# Patient Record
Sex: Female | Born: 2000 | Race: White | Hispanic: No | Marital: Single | Smoking: Never smoker
Health system: Southern US, Community
[De-identification: ages and names within clinical notes are randomized; demographics above are authoritative.]

## PROBLEM LIST (undated history)

## (undated) DIAGNOSIS — J302 Other seasonal allergic rhinitis: Secondary | ICD-10-CM

## (undated) HISTORY — PX: TYMPANOSTOMY TUBE PLACEMENT: SHX32

---
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
## 2022-01-23 ENCOUNTER — Other Ambulatory Visit: Payer: Self-pay | Admitting: Physician Assistant

## 2022-02-20 ENCOUNTER — Encounter: Payer: Self-pay | Admitting: Physician Assistant

## 2022-02-21 ENCOUNTER — Encounter: Payer: Self-pay | Admitting: Physician Assistant

## 2022-02-21 MED ORDER — NORETHINDRONE 0.35 MG PO TABS: 1.0000 | ORAL_TABLET | Freq: Every day | ORAL | 0 refills | Status: DC

## 2022-02-21 NOTE — Telephone Encounter (Signed)
Pt states took the last pill on Saturday (05/13). ?Pt would like a call back about whether or not she needs to double up on pills or use another form of birth control. ? ?.. ?Encourage patient to contact the pharmacy for refills or they can request refills through [facility] ? ?LAST APPOINTMENT DATE:  ?12/30/21 - [facility] Ear pain ?08/31/22 - SW Yeast Infection ? ?NEXT APPOINTMENT DATE: ?N/a ? ? ?MEDICATION: ?citalopram (CELEXA) 10 MG tablet [[id number]]  ? ?Is the patient out of medication?  ?Yes ? ?PHARMACY: ?[id number] [place], [place] - [address]  ?[address], [place] [address]  ?Phone:  [phone]  Fax:  [phone]  ? ? ? ?Let patient know to contact pharmacy at the end of the day to make sure medication is ready. ? ?Please notify patient to allow 48-72 hours to process  ?

## 2022-02-21 NOTE — Telephone Encounter (Signed)
See other MyChart message

## 2022-02-21 NOTE — Telephone Encounter (Signed)
Spoke to pt told her I need to know name of birth control. We have Junel? Pt said she will have to let me know she is in class right now. Told her okay, once I know will send in for you. Also you should not need a refill on Celexa 90 day supply was sent in last. Pt said she does not just birth control. Told pt okay to send my chart message with name of pill. Pt verbalized understanding. ?

## 2022-02-24 NOTE — Telephone Encounter (Signed)
Please see note and advise  

## 2022-03-29 ENCOUNTER — Telehealth: Payer: Self-pay | Admitting: Physician Assistant

## 2022-03-29 NOTE — Telephone Encounter (Signed)
Left message on voicemail to call office. Immunization records are ready for pickup, placed up front.

## 2022-03-29 NOTE — Telephone Encounter (Signed)
Left message pt's mobile number on voicemail to call office. Tried other number twice voicemail not set up. Immunization records are ready for pick up.Placed up front.

## 2022-03-29 NOTE — Telephone Encounter (Signed)
Patient's mother called regarding: Patient is requesting to pick up all vaccination including Covid vaccines no later than 03/30/22. Requests Patient be called at ph# [phone] when records are ready for pick up.

## 2022-03-30 NOTE — Telephone Encounter (Signed)
Noted, did not realized pt picked up forms due to she never returned my call.

## 2022-03-30 NOTE — Telephone Encounter (Signed)
Left message on voicemail to call office. Sending pt My Chart message.

## 2022-03-30 NOTE — Telephone Encounter (Signed)
Pt states she received a call from [facility] about forms. States she has already picked up forms.  FO Rep did not find any documents in the pick-up folder. Instructed pt we will contact her if there is anything else needed from her.    If any further action is needed by the patient please send a patient message, otherwise disregard.

## 2022-04-23 ENCOUNTER — Other Ambulatory Visit: Payer: Self-pay | Admitting: Physician Assistant

## 2022-05-11 ENCOUNTER — Other Ambulatory Visit: Payer: Self-pay | Admitting: Physician Assistant

## 2022-05-26 DIAGNOSIS — S9032XA Contusion of left foot, initial encounter: Secondary | ICD-10-CM | POA: Diagnosis not present

## 2022-07-04 ENCOUNTER — Encounter: Payer: Self-pay | Admitting: Physician Assistant

## 2022-07-04 ENCOUNTER — Encounter: Payer: Self-pay | Admitting: *Deleted

## 2022-07-04 ENCOUNTER — Other Ambulatory Visit: Payer: Self-pay | Admitting: Physician Assistant

## 2022-07-05 ENCOUNTER — Other Ambulatory Visit: Payer: Self-pay | Admitting: Physician Assistant

## 2022-07-05 MED ORDER — CITALOPRAM HYDROBROMIDE 10 MG PO TABS: 10.0000 mg | ORAL_TABLET | Freq: Every day | ORAL | 0 refills | Status: DC

## 2022-07-06 ENCOUNTER — Ambulatory Visit (INDEPENDENT_AMBULATORY_CARE_PROVIDER_SITE_OTHER): Payer: BC Managed Care – PPO | Admitting: Physician Assistant

## 2022-07-06 ENCOUNTER — Encounter: Payer: Self-pay | Admitting: Physician Assistant

## 2022-07-06 VITALS — BP 98/60 | HR 71 | Temp 97.7°F | Ht 68.0 in | Wt 120.0 lb

## 2022-07-06 DIAGNOSIS — F411 Generalized anxiety disorder: Secondary | ICD-10-CM

## 2022-07-06 DIAGNOSIS — Z3041 Encounter for surveillance of contraceptive pills: Secondary | ICD-10-CM | POA: Diagnosis not present

## 2022-07-06 MED ORDER — CITALOPRAM HYDROBROMIDE 10 MG PO TABS: 10.0000 mg | ORAL_TABLET | Freq: Every day | ORAL | 3 refills | Status: AC

## 2022-07-06 MED ORDER — NORETHINDRONE 0.35 MG PO TABS: 1.0000 | ORAL_TABLET | Freq: Every day | ORAL | 3 refills | Status: DC

## 2022-07-06 NOTE — Progress Notes (Signed)
Janice Norton is a 21 y.o. female here for a follow up of a pre-existing problem.  History of Present Illness:   Chief Complaint  Patient presents with   Anxiety    Anxiety      Anxiety Last seen by me for this May 2022. We sent her to the adolescent clinic but she did not go. She is currently taking celexa 10 mg daily.  She tried the celexa 20 mg daily and did not do well with this.   She is working hard with her part-time jobs and school. She has not made any friends at [facility] [employer] yet.   OCP Currently taking the micronor 0.35 mg daily. She is tolerating well. She has had issues with not taking this consistently daily and therefore having irregular bleeding.      Past Medical History:  Diagnosis Date   Seasonal allergies      Social History   Tobacco Use   Smoking status: Never   Smokeless tobacco: Never  Vaping Use   Vaping Use: Never used  Substance Use Topics   Alcohol use: No   Drug use: No    Past Surgical History:  Procedure Laterality Date   TYMPANOSTOMY TUBE PLACEMENT      History reviewed. No pertinent family history.  No Known Allergies  Current Medications:   Current Outpatient Medications:    citalopram (CELEXA) 10 MG tablet, Take 1 tablet (10 mg total) by mouth daily., Disp: 30 tablet, Rfl: 0   fluticasone (FLONASE) 50 MCG/ACT nasal spray, SMARTSIG:2 Spray(s) Both Nares Every Night, Disp: , Rfl:    norethindrone (MICRONOR) 0.35 MG tablet, TAKE 1 TABLET BY MOUTH EVERY DAY, Disp: 84 tablet, Rfl: 0   Review of Systems:   ROS Negative unless otherwise specified per HPI.  Vitals:   Vitals:   07/06/22 0953  BP: 98/60  Pulse: 71  Temp: 97.7 F (36.5 C)  TempSrc: Temporal  SpO2: 97%  Weight: 120 lb (54.4 kg)  Height: 5\' 8"  (1.727 m)     Body mass index is 18.25 kg/m.  Physical Exam:   Physical Exam Vitals and nursing note reviewed.  Constitutional:      General: She is not in acute distress.    Appearance: She is well-developed.  She is not ill-appearing or toxic-appearing.  Cardiovascular:     Rate and Rhythm: Normal rate and regular rhythm.     Pulses: Normal pulses.     Heart sounds: Normal heart sounds, S1 normal and S2 normal.  Pulmonary:     Effort: Pulmonary effort is normal.     Breath sounds: Normal breath sounds.  Skin:    General: Skin is warm and dry.  Neurological:     Mental Status: She is alert.     GCS: GCS eye subscore is 4. GCS verbal subscore is 5. GCS motor subscore is 6.  Psychiatric:        Speech: Speech normal.        Behavior: Behavior normal. Behavior is cooperative.     Assessment and Plan:   GAD (generalized anxiety disorder) Overall controlled per patient Continue celexa 10 mg daily Follow-up in 1 year, sooner if concerns Pursue talk therapy Denies SI/HI I discussed with patient that if they develop any SI, to tell someone immediately and seek medical attention.  Encounter for surveillance of contraceptive pills No red flags Continue micronor Discussed need to take at same time daily  Inda Coke, PA-C

## 2022-07-17 ENCOUNTER — Encounter: Payer: Self-pay | Admitting: Family Medicine

## 2022-10-18 ENCOUNTER — Ambulatory Visit: Payer: BC Managed Care – PPO | Admitting: Physician Assistant

## 2022-10-18 ENCOUNTER — Ambulatory Visit (INDEPENDENT_AMBULATORY_CARE_PROVIDER_SITE_OTHER): Payer: BC Managed Care – PPO | Admitting: Physician Assistant

## 2022-10-18 ENCOUNTER — Encounter: Payer: Self-pay | Admitting: Physician Assistant

## 2022-10-18 ENCOUNTER — Telehealth: Payer: Self-pay | Admitting: Physician Assistant

## 2022-10-18 VITALS — BP 110/70 | HR 97 | Temp 97.8°F | Ht 68.0 in | Wt 120.0 lb

## 2022-10-18 DIAGNOSIS — R220 Localized swelling, mass and lump, head: Secondary | ICD-10-CM | POA: Diagnosis not present

## 2022-10-18 DIAGNOSIS — K1379 Other lesions of oral mucosa: Secondary | ICD-10-CM

## 2022-10-18 MED ORDER — DOXYCYCLINE HYCLATE 100 MG PO TABS: 100.0000 mg | ORAL_TABLET | Freq: Two times a day (BID) | ORAL | 0 refills | Status: DC

## 2022-10-18 MED ORDER — FLUCONAZOLE 150 MG PO TABS: ORAL_TABLET | ORAL | 0 refills | Status: DC

## 2022-10-18 MED ORDER — METHYLPREDNISOLONE ACETATE 80 MG/ML IJ SUSP
80.0000 mg | Freq: Once | INTRAMUSCULAR | Status: AC
  Administered 2022-10-18: 80 mg via INTRAMUSCULAR

## 2022-10-18 NOTE — Telephone Encounter (Signed)
Spoke to [place], told her I can not give her any information due to not on DPR. Told her make sure keeps appt today and will discuss at appt. Gina verbalized understanding.

## 2022-10-18 NOTE — Progress Notes (Signed)
Janice Norton is a 22 y.o. female here for a follow up of a pre-existing problem.  History of Present Illness:   Chief Complaint  Patient presents with   Mouth Lesions    Pt c/o mouth sore upper lip started last Thursday and now has worsened. Top lip is very swollen, hurts to talk. Pt went to Urgent Care yesterday, was given mouth wash and prednisone, took 2 tablets this morning. Told to f/u with PCP.    HPI  Mouth sores Patient complains of sores along the inside of her lips which started as just 1 cold sore x5 days ago. She reports severe pain associated with these as well as upper lip swelling. She was seen at urgent care yesterday and was given a prescription mouthwash. Patient was also started on Prednisone but did not take any until this morning. She reports having a sore throat and fatigue last week before her other symptoms started. She has been unable to eat, drink, or sleep much due to the pain.  Patient denies any new skin care products, new foods/drinks, or toothpastes. She reports using a new mouthwash in December but only used it once. She does work at a day care and her mother is worried that she could have been exposed to hand, foot, mouth.  Patient denies any rashes or itching elsewhere on her body. She denies any URI symptoms. She denies any history of cold sores in the past.  She is UTD on dental exams and has established care.  Past Medical History:  Diagnosis Date   Seasonal allergies      Social History   Tobacco Use   Smoking status: Never   Smokeless tobacco: Never  Vaping Use   Vaping Use: Never used  Substance Use Topics   Alcohol use: No   Drug use: No    Past Surgical History:  Procedure Laterality Date   TYMPANOSTOMY TUBE PLACEMENT      History reviewed. No pertinent family history.  No Known Allergies  Current Medications:   Current Outpatient Medications:    citalopram (CELEXA) 10 MG tablet, Take 1 tablet (10 mg total) by mouth  daily., Disp: 90 tablet, Rfl: 3   doxycycline (VIBRA-TABS) 100 MG tablet, Take 1 tablet (100 mg total) by mouth 2 (two) times daily., Disp: 20 tablet, Rfl: 0   fluconazole (DIFLUCAN) 150 MG tablet, Take 1 tablet as needed, Disp: 1 tablet, Rfl: 0   lidocaine (XYLOCAINE) 2 % solution, Use as directed 15 mLs in the mouth or throat every 4 (four) hours as needed., Disp: , Rfl:    methylPREDNISolone (MEDROL DOSEPAK) 4 MG TBPK tablet, Take by mouth., Disp: , Rfl:    norethindrone (MICRONOR) 0.35 MG tablet, Take 1 tablet (0.35 mg total) by mouth daily., Disp: 84 tablet, Rfl: 3   Review of Systems:   Review of Systems  Constitutional:  Positive for malaise/fatigue. Negative for fever.  HENT:  Positive for sore throat. Negative for congestion and sinus pain.        + mouth sores  Respiratory:  Negative for cough, shortness of breath and wheezing.   Cardiovascular:  Negative for chest pain and palpitations.  Gastrointestinal:  Negative for blood in stool, constipation, diarrhea, nausea and vomiting.  Genitourinary:  Negative for dysuria, frequency and hematuria.  Musculoskeletal:  Negative for joint pain and myalgias.  Skin:  Negative for itching and rash.    Vitals:   Vitals:   10/18/22 1338  BP: 110/70  Pulse: 97  Temp: 97.8 F (36.6 C)  TempSrc: Temporal  SpO2: 99%  Weight: 120 lb (54.4 kg)  Height: 5\' 8"  (1.727 m)     Body mass index is 18.25 kg/m.  Physical Exam:   Physical Exam Vitals and nursing note reviewed.  Constitutional:      General: She is not in acute distress.    Appearance: She is well-developed. She is not ill-appearing or toxic-appearing.  HENT:     Head: Normocephalic and atraumatic.     Right Ear: Tympanic membrane, ear canal and external ear normal. Tympanic membrane is not erythematous, retracted or bulging.     Left Ear: Tympanic membrane, ear canal and external ear normal. Tympanic membrane is not erythematous, retracted or bulging.     Nose: Nose  normal.     Right Sinus: No maxillary sinus tenderness or frontal sinus tenderness.     Left Sinus: No maxillary sinus tenderness or frontal sinus tenderness.     Mouth/Throat:     Pharynx: Uvula midline. No posterior oropharyngeal erythema.  Eyes:     General: Lids are normal.     Conjunctiva/sclera: Conjunctivae normal.  Neck:     Trachea: Trachea normal.  Cardiovascular:     Rate and Rhythm: Normal rate and regular rhythm.     Pulses: Normal pulses.     Heart sounds: Normal heart sounds, S1 normal and S2 normal.  Pulmonary:     Effort: Pulmonary effort is normal.     Breath sounds: Normal breath sounds. No decreased breath sounds, wheezing, rhonchi or rales.  Lymphadenopathy:     Cervical: No cervical adenopathy.  Skin:    General: Skin is warm and dry.     Comments: Significantly swollen upper lip with superficial breakdown on inner aspect  Neurological:     Mental Status: She is alert.     GCS: GCS eye subscore is 4. GCS verbal subscore is 5. GCS motor subscore is 6.  Psychiatric:        Speech: Speech normal.        Behavior: Behavior normal. Behavior is cooperative.       Assessment and Plan:   Lip swelling No red flags on exam Patient also seen by Dr -- differential includes: angioedema, HSV, SJS, allergic dermatitis, among others Patient was advised as follows: Stop: -Mouthwash -Prescribed ointment -Birth control pills -Celexa  80 mg Steroid injection today --> hold your oral steroid for now  Start:  -Claritin or Zyrtec (generic is fine) -Pepcid (generic is fine) -Doxycyline  I have sent in diflucan in case you get a yeast infection with this  ANY WORSENING -- go to the ER  Get help right away if: You have severe swelling of your mouth, tongue, or lips. Your swelling gets worse. You have trouble breathing, swallowing, or talking. You have chest pain, dizziness or light-headedness, or you pass out.  Let's follow-up in 1 month to  discuss anxiety and periods.  I,Alexis Herring,acting as a Jacquiline Doe for Neurosurgeon, PA.,have documented all relevant documentation on the behalf of [employer], PA,as directed by  Jarold Motto, PA while in the presence of Jarold Motto, Jarold Motto.  I, [place], Jarold Motto, have reviewed all documentation for this visit. The documentation on 10/18/22 for the exam, diagnosis, procedures, and orders are all accurate and complete.  Time spent with patient today was 30 minutes which consisted of chart review, discussing diagnosis, work up, treatment answering questions and documentation.  12/17/22, PA-C

## 2022-10-18 NOTE — Telephone Encounter (Signed)
Patient is scheduled for appointment today at 3:20 pm re:  Caller Barnett Applebaum) states Patient was seen at Urgent Care 10/17/22 for Painful Lips and mouth-sores on inside lips. Was prescribed Prednisone Pack-this morning Patient took the first 2 tablets this morning.  Caller requests to be called to be advised if Patient should continue taking Prednisone or should Patient get a Prednisone shot and stop taking the tablets.

## 2022-10-18 NOTE — Patient Instructions (Addendum)
It was great to see you!  Stop: -Mouthwash -Prescribed ointment -Birth control pills -Celexa  Steroid injection today --> hold your oral steroid for now  Start:  -Claritin or Zyrtec (generic is fine) -Pepcid (generic is fine) -Doxycyline  I have sent in diflucan in case you get a yeast infection with this  ANY WORSENING -- go to the ER  Get help right away if: You have severe swelling of your mouth, tongue, or lips. Your swelling gets worse. You have trouble breathing, swallowing, or talking. You have chest pain, dizziness or light-headedness, or you pass out.  Let's follow-up in 1 month to discuss anxiety and periods.  Take care,  Inda Coke PA-C

## 2022-10-26 ENCOUNTER — Encounter: Payer: Self-pay | Admitting: Physician Assistant

## 2022-10-27 ENCOUNTER — Emergency Department (HOSPITAL_BASED_OUTPATIENT_CLINIC_OR_DEPARTMENT_OTHER)
Admission: EM | Admit: 2022-10-27 | Discharge: 2022-10-27 | Disposition: A | Discharge status: Home / Self Care | Payer: BC Managed Care – PPO | Attending: Emergency Medicine | Admitting: Emergency Medicine

## 2022-10-27 ENCOUNTER — Telehealth: Payer: Self-pay | Admitting: Physician Assistant

## 2022-10-27 ENCOUNTER — Other Ambulatory Visit: Payer: Self-pay

## 2022-10-27 ENCOUNTER — Emergency Department (HOSPITAL_BASED_OUTPATIENT_CLINIC_OR_DEPARTMENT_OTHER): Payer: BC Managed Care – PPO

## 2022-10-27 ENCOUNTER — Encounter: Payer: Self-pay | Admitting: Physician Assistant

## 2022-10-27 ENCOUNTER — Encounter (HOSPITAL_BASED_OUTPATIENT_CLINIC_OR_DEPARTMENT_OTHER): Payer: Self-pay

## 2022-10-27 DIAGNOSIS — R519 Headache, unspecified: Secondary | ICD-10-CM | POA: Diagnosis not present

## 2022-10-27 MED ORDER — KETOROLAC TROMETHAMINE 15 MG/ML IJ SOLN
15.0000 mg | Freq: Once | INTRAMUSCULAR | Status: AC
  Administered 2022-10-27: 15 mg via INTRAVENOUS
  Filled 2022-10-27: qty 1

## 2022-10-27 MED ORDER — PROCHLORPERAZINE EDISYLATE 10 MG/2ML IJ SOLN
10.0000 mg | Freq: Once | INTRAMUSCULAR | Status: AC
  Administered 2022-10-27: 10 mg via INTRAVENOUS
  Filled 2022-10-27: qty 2

## 2022-10-27 MED ORDER — SODIUM CHLORIDE 0.9 % IV BOLUS
1000.0000 mL | Freq: Once | INTRAVENOUS | Status: AC
  Administered 2022-10-27: 1000 mL via INTRAVENOUS

## 2022-10-27 MED ORDER — DIPHENHYDRAMINE HCL 50 MG/ML IJ SOLN
25.0000 mg | Freq: Once | INTRAMUSCULAR | Status: AC
  Administered 2022-10-27: 25 mg via INTRAVENOUS
  Filled 2022-10-27: qty 1

## 2022-10-27 NOTE — ED Notes (Signed)
Pt verbalized understanding of d/c instructions, meds, and followup care. Denies questions. VSS, no distress noted. Steady gait to exit with all belongings.  ?

## 2022-10-27 NOTE — Telephone Encounter (Signed)
Pt message was sent to triage, waiting for update

## 2022-10-27 NOTE — ED Triage Notes (Signed)
Patient POV from Home with Mother.  Endorses Consistent/constant Headache for approximately 3 Days. Associated with nausea, Neck pain, photosensitivity. Localized to Top of Head.   OTC Medications have not been effective. No History of Migraines. No Fevers.   NAD Noted during Triage. A&Ox4. GCS 15. Ambulatory.

## 2022-10-27 NOTE — ED Provider Notes (Signed)
[facility] EMERGENCY DEPT Provider Note   CSN: [id number] Arrival date & time: 10/27/22  1543     History Chief Complaint  Patient presents with   Headache    Janice Norton is a 22 y.o. female patient who presents to the emergency department today for further evaluation of a headache this been ongoing for 3 days.  Mother is at bedside and provides most of the history.  She states that approximately 1 week ago she had an allergic type reaction to either her birth control and/or her anxiety medications.  She was meetly taken off it with her primary care doctor and started on a steroid, Pepcid, and antibiotic.  She completely resolved with this.  However, 3 days ago she started having a headache which she localizes to the crown of her head.  She describes as a throbbing sensation.  She has been taking over-the-counter Tylenol, ibuprofen, and Excedrin with no relief.  She reports associated photophobia, phonophobia, and nausea.  She denies any vaginal symptoms, fever, chills.  She also endorses neck pain.   Headache      Home Medications Prior to Admission medications   Medication Sig Start Date End Date Taking? Authorizing Provider  citalopram (CELEXA) 10 MG tablet Take 1 tablet (10 mg total) by mouth daily. 07/06/22   Inda Coke, PA  doxycycline (VIBRA-TABS) 100 MG tablet Take 1 tablet (100 mg total) by mouth 2 (two) times daily. 10/18/22   Inda Coke, PA  fluconazole (DIFLUCAN) 150 MG tablet Take 1 tablet as needed 10/18/22   Inda Coke, PA  lidocaine (XYLOCAINE) 2 % solution Use as directed 15 mLs in the mouth or throat every 4 (four) hours as needed. 10/17/22   [provider]  methylPREDNISolone (MEDROL DOSEPAK) 4 MG TBPK tablet Take by mouth. 10/17/22   [provider]  norethindrone (MICRONOR) 0.35 MG tablet Take 1 tablet (0.35 mg total) by mouth daily. 07/06/22   Inda Coke, PA      Allergies    Patient has no known allergies.     Review of Systems   Review of Systems  Neurological:  Positive for headaches.  All other systems reviewed and are negative.   Physical Exam Updated Vital Signs BP 132/85   Pulse 68   Temp (!) 97.4 F (36.3 C)   Resp 16   Ht 5\' 8"  (1.727 m)   Wt 54.4 kg   LMP 10/13/2022   SpO2 100%   BMI 18.24 kg/m  Physical Exam Vitals and nursing note reviewed.  Constitutional:      Appearance: Normal appearance.  HENT:     Head: Normocephalic and atraumatic.  Eyes:     General:        Right eye: No discharge.        Left eye: No discharge.     Conjunctiva/sclera: Conjunctivae normal.  Neck:     Comments: No meningismus. Pulmonary:     Effort: Pulmonary effort is normal.  Musculoskeletal:     Cervical back: Full passive range of motion without pain and neck supple.  Skin:    General: Skin is warm and dry.     Findings: No rash.  Neurological:     General: No focal deficit present.     Mental Status: She is alert.     Comments: Cranial nerves II through XII are intact.  5/5 strength to the upper and lower extremities.  Normal sensation to the upper and lower extremities.  No dysmetria with finger-nose.  No pronator drift.  Speaking in complete sentences.  No obvious nystagmus with extraocular movements.  Psychiatric:        Mood and Affect: Mood normal.        Behavior: Behavior normal.     ED Results / Procedures / Treatments   Labs (all labs ordered are listed, but only abnormal results are displayed) Labs Reviewed - No data to display  EKG None  Radiology No results found.  Procedures Procedures    Medications Ordered in ED Medications  sodium chloride 0.9 % bolus 1,000 mL (has no administration in time range)    ED Course/ Medical Decision Making/ A&P Clinical Course as of 10/27/22 1845  Thu Oct 27, 2022  1830 On reevaluation, patient's headache is feeling better. She states she does feel a little strange after the medication. Will continue to observe.   [CF]  1840 Repeat evaluation, patient is feeling better and that sensation has passed.  This is likely from the Compazine.  Her headache is much improved and I will plan to discharge her home.  All questions and concerns addressed. [CF]    Clinical Course User Index [CF] Hendricks Limes, PA-C   {   Click here for ABCD2, HEART and other calculators                          Medical Decision Making Towana Stenglein is a 22 y.o. female patient who presents to the emergency department today for further evaluation of a headache this been ongoing for 3 days.  Given the severity of her headache I will likely get a CT scan of her head to further evaluate for possible AVM or other intracranial pathologies.  I have a low suspicion for meningitis.  Patient has no meningeal signs.  CT scan was normal.  Again I have a low suspicion for meningitis.  Vital signs are normal here.  Have given her a migraine cocktail which showed significant improvement in her headache.  All questions and concerns addressed with mother at bedside.  I given the work note.  Will have him follow-up with her primary care doctor if symptoms return or persist.  She was encouraged to return to the emergency department for any worsening symptoms.  She is safe for discharge.   Amount and/or Complexity of Data Reviewed Radiology: ordered.  Risk Prescription drug management.    Final Clinical Impression(s) / ED Diagnoses Final diagnoses:  None    Rx / DC Orders ED Discharge Orders     None         Hendricks Limes, [place] 10/27/22 1853    Blanchie Dessert, MD 10/27/22 2304

## 2022-10-27 NOTE — Discharge Instructions (Signed)
As we discussed today, this is likely a migraine headache.  The CT scan was normal.  I we will write you out for school and I will write her mother out for work.  Please drink plenty of fluids and get plenty of rest tomorrow.  Would like for you to follow-up with your primary care doctor if symptoms come back or persist.  You may return to the emergency department any time for worsening symptoms.

## 2022-10-27 NOTE — Telephone Encounter (Signed)
Patient advised to go ED   Patient Name: Janice Norton Gender: Female DOB: 11/24/2000 Age: 22 Y 53 M 13 D Return Phone Number: [phone] (Primary) Address: City/ State/ Zip: [place]  [address] Client [facility] Client Site [facility] [place] [facility] Morene Rankins, [place]- PA Contact Type Call Who Is Calling Patient / Member / Family / Caregiver Call Type Triage / Clinical Caller Name Barnett Applebaum Relationship To Patient Mother Return Phone Number [phone] (Primary) Chief Complaint FAINTING or [place] Reason for Call Symptomatic / Request for [place] states her daughter has had a constant throbbing headache on the top of her head for 3 days. Caller states she has neasea, dizzinees and feels like she is going to faint. Caller states she been taking over the counter migrain medication but it has not helped. [facility] [address] Translation No Nurse Assessment Nurse: Nicki Reaper, RN, Malachy Mood Date/Time Eilene Ghazi Time): 10/27/2022 3:07:37 PM Confirm and document reason for call. If symptomatic, describe symptoms. ---Caller states her daughter has a constant, throbbing HA that started 3 days ago, pain is on top of her head, rates pain 9/10, has taken Excedrin Migraine, has nausea, dizziness (vertigo) and at times she feels like she is going to faint, has taken Excedrin, has tried cool compress, caffeine, she tested negative for Covid today via home test, last week had lip swelling from an allergic reaction to birth control or her anxiety medication, instructed to stop both medications, received a steroid injection, and abx (mom is sure name of abx) is drinking and voiding, last BM was 3 days ago, Does the patient have any new or worsening symptoms? ---Yes Will a triage be completed? ---Yes Related visit to physician within the last 2 weeks?  ---No Does the PT have any chronic conditions? (i.e. diabetes, asthma, this includes High risk factors for pregnancy, etc.) ---No PLEASE NOTE: All timestamps contained within this report are represented as [place] Standard Time. CONFIDENTIALTY NOTICE: This fax transmission is intended only for the addressee. It contains information that is legally privileged, confidential or otherwise protected from use or disclosure. If you are not the intended recipient, you are strictly prohibited from reviewing, disclosing, copying using or disseminating any of this information or taking any action in reliance on or regarding this information. If you have received this fax in error, please notify us immediately by telephone so that we can arrange for its return to [place]. Phone: [phone], Toll-Free: [phone], Fax: [phone] Page: 2 of 2 Call Id: [id number] Nurse Assessment Is the patient pregnant or possibly pregnant? (Ask all females between the ages of 12-55) ---No Is this a behavioral health or substance abuse call? ---No Guidelines Guideline Title Affirmed Question Affirmed Notes Nurse Date/Time Eilene Ghazi Time) Headache [1] SEVERE headache (e.g., excruciating) AND [2] "worst headache" of life Baltazar Apo 10/27/2022 3:14:34 PM Disp. Time Eilene Ghazi Time) Disposition Final User 10/27/2022 3:06:02 PM Send to Urgent Ronie Spies, [place] 10/27/2022 3:17:40 PM Go to ED Now (or PCP triage) Yes Nicki Reaper, RN, Malachy Mood Final Disposition 10/27/2022 3:17:40 PM Go to ED Now (or PCP triage) Yes Nicki Reaper, RN, [place]rs[place]ine Speed Disagree/Comply Comply Caller Understands Yes PreDisposition Call Doctor Care Advice Given Per Guideline GO TO ED NOW (OR PCP TRIAGE): PAIN MEDICINES - EXTRA NOTES AND WARNINGS: ANOTHER ADULT SHOULD DRIVE: * It is better and safer if another adult drives instead of you. Referrals GO TO FACILITY OTHER -  SPECIFY

## 2022-10-27 NOTE — Telephone Encounter (Signed)
Patient's mother Barnett Applebaum states Patient has had a constant, throbbing headache (top of head) for 3 days. Unable to sleep or get comfortable in any way. States Patient is nauseous, dizzy and feels like she is going to faint, especially when standing up.  States Patient has been taking over the counter Excedrin Migraine for 2 days with no relief.  Transferred to Triage Caryl Pina).

## 2022-11-08 ENCOUNTER — Encounter: Payer: Self-pay | Admitting: Physician Assistant

## 2022-11-14 ENCOUNTER — Ambulatory Visit (INDEPENDENT_AMBULATORY_CARE_PROVIDER_SITE_OTHER): Payer: BC Managed Care – PPO | Admitting: Physician Assistant

## 2022-11-14 ENCOUNTER — Encounter: Payer: Self-pay | Admitting: Physician Assistant

## 2022-11-14 VITALS — BP 90/60 | HR 73 | Temp 97.5°F | Ht 68.0 in | Wt 117.2 lb

## 2022-11-14 DIAGNOSIS — L700 Acne vulgaris: Secondary | ICD-10-CM | POA: Diagnosis not present

## 2022-11-14 DIAGNOSIS — Z3041 Encounter for surveillance of contraceptive pills: Secondary | ICD-10-CM

## 2022-11-14 DIAGNOSIS — F411 Generalized anxiety disorder: Secondary | ICD-10-CM | POA: Diagnosis not present

## 2022-11-14 DIAGNOSIS — F339 Major depressive disorder, recurrent, unspecified: Secondary | ICD-10-CM | POA: Diagnosis not present

## 2022-11-14 LAB — POCT URINE PREGNANCY: Preg Test, Ur: NEGATIVE

## 2022-11-14 MED ORDER — CLINDAMYCIN PHOS-BENZOYL PEROX 1-5 % EX GEL: Freq: Two times a day (BID) | CUTANEOUS | 0 refills | Status: AC

## 2022-11-14 MED ORDER — NORETHINDRONE 0.35 MG PO TABS: 1.0000 | ORAL_TABLET | Freq: Every day | ORAL | 3 refills | Status: DC

## 2022-11-14 NOTE — Progress Notes (Signed)
Janice Norton is a 22 y.o. female here for a new problem.  History of Present Illness:   Chief Complaint  Patient presents with   Contraception    Pt here to discuss birth control     HPI  Contraception We previously stopped her Micronor because there was concern for angioedema and we stopped all medications conservatively She would like to resume contraception In the past when we tried an estrogen containing birth control she had an episode of shakiness, nausea and was reluctant to continue taking She would like to continue on Micronor, she is not sexually active at this time  Acne She is not using any regular skin care routine at this time She feels like she struggles with acne on a regular basis  did take proactive in the past and this did not help  Anxiety and depression Anxiety seems to be improved but depression is ongoing She would like to potentially eventually restart her Celexa 10 mg but for now she would like to restart 1 medication at a time and make sure she tolerates this Denies current SI/HI   Past Medical History:  Diagnosis Date   Seasonal allergies      Social History   Tobacco Use   Smoking status: Never   Smokeless tobacco: Never  Vaping Use   Vaping Use: Never used  Substance Use Topics   Alcohol use: No   Drug use: No    Past Surgical History:  Procedure Laterality Date   TYMPANOSTOMY TUBE PLACEMENT      History reviewed. No pertinent family history.  No Known Allergies  Current Medications:   Current Outpatient Medications:    clindamycin-benzoyl peroxide (BENZACLIN) gel, Apply topically 2 (two) times daily., Disp: 25 g, Rfl: 0   citalopram (CELEXA) 10 MG tablet, Take 1 tablet (10 mg total) by mouth daily. (Patient not taking: Reported on 11/14/2022), Disp: 90 tablet, Rfl: 3   doxycycline (VIBRA-TABS) 100 MG tablet, Take 1 tablet (100 mg total) by mouth 2 (two) times daily. (Patient not taking: Reported on 11/14/2022), Disp: 20 tablet,  Rfl: 0   fluconazole (DIFLUCAN) 150 MG tablet, Take 1 tablet as needed (Patient not taking: Reported on 11/14/2022), Disp: 1 tablet, Rfl: 0   norethindrone (MICRONOR) 0.35 MG tablet, Take 1 tablet (0.35 mg total) by mouth daily., Disp: 84 tablet, Rfl: 3   Review of Systems:   ROS Negative unless otherwise specified per HPI.   Vitals:   Vitals:   11/14/22 1500  BP: 90/60  Pulse: 73  Temp: (!) 97.5 F (36.4 C)  TempSrc: Temporal  SpO2: 98%  Weight: 117 lb 4 oz (53.2 kg)  Height: 5\' 8"  (1.727 m)     Body mass index is 17.83 kg/m.  Physical Exam:   Physical Exam Constitutional:      Appearance: Normal appearance. She is well-developed.  HENT:     Head: Normocephalic and atraumatic.  Eyes:     General: Lids are normal.     Extraocular Movements: Extraocular movements intact.     Conjunctiva/sclera: Conjunctivae normal.  Pulmonary:     Effort: Pulmonary effort is normal.  Musculoskeletal:        General: Normal range of motion.     Cervical back: Normal range of motion and neck supple.  Skin:    General: Skin is warm and dry.  Neurological:     Mental Status: She is alert and oriented to person, place, and time.  Psychiatric:  Attention and Perception: Attention and perception normal.        Mood and Affect: Mood normal.        Behavior: Behavior normal.        Thought Content: Thought content normal.        Judgment: Judgment normal.     Assessment and Plan:   Encounter for surveillance of contraceptive pills Pregnancy test is negative Restart Micronor Follow-up in 1 year, sooner if concerns  GAD (generalized anxiety disorder) Well-controlled per patient Continue to monitor  Depression, recurrent ([place]) Uncontrolled, but denies any suicidal ideation or plan We considered restarting her Celexa and we will wait about 2 weeks after she starts her Micronor for her to consider this due to recent concerns for possible angioedema I have also sent a MyChart  message in the next few weeks if she would like to restart this I discussed with patient that if they develop any SI, to tell someone immediately and seek medical attention.  Acne vulgaris Uncontrolled Discussed need for better skin care and we will also trial topical BenzaClin Follow-up as needed  Inda Coke, PA-C

## 2022-11-14 NOTE — Patient Instructions (Signed)
It was great to see you!  Morning: Cetaphil or Cerave -- facial wash Moisturizer Sunscreen  Night: Cetaphil or Cerave -- facial wash Benzaclin Moisturizer  Restart micronor  Message me in 2-4 weeks if you want to restart the Celexa for your mood   Please let me know ASAP if lip swelling returns!  Take care,  Inda Coke PA-C

## 2022-11-15 ENCOUNTER — Encounter: Payer: Self-pay | Admitting: Physician Assistant

## 2022-12-26 ENCOUNTER — Encounter: Payer: Self-pay | Admitting: Physician Assistant

## 2022-12-26 NOTE — Telephone Encounter (Signed)
Please see message. °

## 2022-12-27 ENCOUNTER — Other Ambulatory Visit: Payer: Self-pay | Admitting: Physician Assistant

## 2022-12-27 MED ORDER — ONDANSETRON 4 MG PO TBDP: 4.0000 mg | ORAL_TABLET | Freq: Three times a day (TID) | ORAL | 0 refills | Status: DC | PRN

## 2023-01-21 ENCOUNTER — Encounter: Payer: Self-pay | Admitting: Physician Assistant

## 2023-01-23 MED ORDER — ONDANSETRON 4 MG PO TBDP: 4.0000 mg | ORAL_TABLET | Freq: Three times a day (TID) | ORAL | 0 refills | Status: DC | PRN

## 2023-03-03 ENCOUNTER — Encounter: Payer: Self-pay | Admitting: Physician Assistant

## 2023-03-03 NOTE — Telephone Encounter (Signed)
Please advise if you would like patient to schedule an appointment.

## 2023-03-06 ENCOUNTER — Other Ambulatory Visit: Payer: Self-pay | Admitting: Physician Assistant

## 2023-03-06 MED ORDER — ONDANSETRON 4 MG PO TBDP: 4.0000 mg | ORAL_TABLET | Freq: Three times a day (TID) | ORAL | 0 refills | Status: DC | PRN

## 2023-03-07 ENCOUNTER — Encounter: Payer: Self-pay | Admitting: Physician Assistant

## 2023-06-04 ENCOUNTER — Other Ambulatory Visit: Payer: Self-pay | Admitting: Physician Assistant

## 2023-06-05 ENCOUNTER — Other Ambulatory Visit: Payer: Self-pay | Admitting: Physician Assistant

## 2023-06-05 MED ORDER — ONDANSETRON 4 MG PO TBDP: 4.0000 mg | ORAL_TABLET | Freq: Three times a day (TID) | ORAL | 0 refills | Status: DC | PRN

## 2023-10-17 ENCOUNTER — Other Ambulatory Visit: Payer: Self-pay | Admitting: Physician Assistant

## 2023-10-17 ENCOUNTER — Encounter: Payer: Self-pay | Admitting: Physician Assistant

## 2023-10-17 MED ORDER — ONDANSETRON HCL 4 MG PO TABS: 4.0000 mg | ORAL_TABLET | Freq: Three times a day (TID) | ORAL | 0 refills | Status: DC | PRN

## 2023-10-17 NOTE — Telephone Encounter (Signed)
 Please advise if okay to keep refilling Zofran for nausea?

## 2023-11-03 NOTE — Telephone Encounter (Signed)
Copied from CRM [id number]. Topic: Clinical - Medical Advice >> Nov 02, 2023  2:11 PM Chantha C wrote: Reason for CRM: Patient feels like birth control is making depressed and wants to discuss. Patient denies any other symptoms. Patient does want to be seen sooner, wants to schedule for February 2025. Scheduled an appointment for 12/03/23 at 10: 20 am. Patient want this to be private, doesn't want to freak out her parents. Advised patient if any new side effects please contact the office immediately. Patient verified understanding and denies any other concerns.

## 2023-11-03 NOTE — Telephone Encounter (Signed)
Noted, appt is 11/13/2023.

## 2023-11-06 ENCOUNTER — Encounter: Payer: Self-pay | Admitting: Physician Assistant

## 2023-11-13 ENCOUNTER — Ambulatory Visit (INDEPENDENT_AMBULATORY_CARE_PROVIDER_SITE_OTHER): Payer: BC Managed Care – PPO | Admitting: Physician Assistant

## 2023-11-13 ENCOUNTER — Ambulatory Visit: Payer: BC Managed Care – PPO | Admitting: Physician Assistant

## 2023-11-13 ENCOUNTER — Encounter: Payer: Self-pay | Admitting: Physician Assistant

## 2023-11-13 VITALS — BP 100/70 | HR 109 | Temp 97.9°F | Ht 68.0 in | Wt 124.4 lb

## 2023-11-13 DIAGNOSIS — R110 Nausea: Secondary | ICD-10-CM

## 2023-11-13 DIAGNOSIS — F339 Major depressive disorder, recurrent, unspecified: Secondary | ICD-10-CM | POA: Diagnosis not present

## 2023-11-13 DIAGNOSIS — F411 Generalized anxiety disorder: Secondary | ICD-10-CM | POA: Diagnosis not present

## 2023-11-13 NOTE — Progress Notes (Signed)
Janice Norton is a 23 y.o. female here for a follow up of a pre-existing problem.  History of Present Illness:   Chief Complaint  Patient presents with   Depression    HPI  GAD/ Depression  She states that she has been feeling sad lately. She has been experiencing stress due to school.  She is wondering if her birth control pill is contributing to her symptoms. She is not interested in stopping her pills at this time.  She is currently not taking Celexa. She states that she feels better without it and is not interested in staying on medication long term.    Denies any suicidal thoughts or any effects on activities or hobbies. Report difficulty falling sleeping during weekdays as she often has so much thoughts in her minds. Plans to start working out as she feels better.  Currently lives at home but has no problem with that.  Also has been experiencing stress due to her sister's health.  Nausea States she often feels her anxiety in her stomach. Usually takes Zofran to help her symptoms.  Denis any other GI complains.  Plans to follow up with school therapist.  Not interested in blood work today.  Past Medical History:  Diagnosis Date   Seasonal allergies      Social History   Tobacco Use   Smoking status: Never   Smokeless tobacco: Never  Vaping Use   Vaping status: Never Used  Substance Use Topics   Alcohol use: No   Drug use: No    Past Surgical History:  Procedure Laterality Date   TYMPANOSTOMY TUBE PLACEMENT      No family history on file.  No Known Allergies  Current Medications:   Current Outpatient Medications:    clindamycin-benzoyl peroxide (BENZACLIN) gel, Apply topically 2 (two) times daily., Disp: 25 g, Rfl: 0   norethindrone (MICRONOR) 0.35 MG tablet, Take 1 tablet (0.35 mg total) by mouth daily., Disp: 84 tablet, Rfl: 3   ondansetron (ZOFRAN) 4 MG tablet, Take 1 tablet (4 mg total) by mouth every 8 (eight) hours as needed for nausea or vomiting.,  Disp: 20 tablet, Rfl: 0   citalopram (CELEXA) 10 MG tablet, Take 1 tablet (10 mg total) by mouth daily. (Patient not taking: Reported on 11/13/2023), Disp: 90 tablet, Rfl: 3   Review of Systems:   Review of Systems  Gastrointestinal:  Positive for nausea. Negative for abdominal pain, blood in stool, constipation, diarrhea and vomiting.  Psychiatric/Behavioral:  Positive for depression. Negative for suicidal ideas. The patient is nervous/anxious.   Negative unless otherwise specified per HPI.  Vitals:   Vitals:   11/13/23 1018  BP: 100/70  Pulse: (!) 109  Temp: 97.9 F (36.6 C)  TempSrc: Temporal  SpO2: 98%  Weight: 124 lb 6.1 oz (56.4 kg)  Height: 5\' 8"  (1.727 m)     Body mass index is 18.91 kg/m.  Physical Exam:   Physical Exam Vitals and nursing note reviewed.  Constitutional:      General: She is not in acute distress.    Appearance: She is well-developed. She is not ill-appearing or toxic-appearing.  Cardiovascular:     Rate and Rhythm: Normal rate and regular rhythm.     Pulses: Normal pulses.     Heart sounds: Normal heart sounds, S1 normal and S2 normal.  Pulmonary:     Effort: Pulmonary effort is normal.     Breath sounds: Normal breath sounds.  Skin:    General: Skin is warm  and dry.  Neurological:     Mental Status: She is alert.     GCS: GCS eye subscore is 4. GCS verbal subscore is 5. GCS motor subscore is 6.  Psychiatric:        Speech: Speech normal.        Behavior: Behavior normal. Behavior is cooperative.     Assessment and Plan:   GAD (generalized anxiety disorder); Depression, recurrent (HCC) Overall stable per patient When she made the appointment she was "having a bad day" and feels better She would like to continue to monitor symptom(s) Declines: hold oral contraceptive pill(s) to see if this is contributing to her symptom(s), obtain blood work today, add/resume ssri Plans to get into talk therapy at [facility] possibly I discussed with patient  that if they develop any SI, to tell someone immediately and seek medical attention. Denies suicidal ideation/hi Follow-up in 6 months for Comprehensive Physical Exam (CPE) preventive care annual visit -- sooner if concerns  Nausea No red flags Continue Zofran as needed for nausea Follow-up as needed   Jarold Motto, PA-C  I,Safa M Kadhim,acting as a scribe for Jarold Motto, PA.,have documented all relevant documentation on the behalf of Jarold Motto, PA,as directed by  Jarold Motto, PA while in the presence of Jarold Motto, [place].   I, Jarold Motto, [place], have reviewed all documentation for this visit. The documentation on 11/13/23 for the exam, diagnosis, procedures, and orders are all accurate and complete.

## 2023-11-13 NOTE — Patient Instructions (Addendum)
It was great to see you!  Take care,  Ellerie Arenz   

## 2023-11-22 ENCOUNTER — Ambulatory Visit: Payer: BC Managed Care – PPO | Admitting: Physician Assistant

## 2023-11-22 ENCOUNTER — Encounter: Payer: Self-pay | Admitting: Physician Assistant

## 2023-11-22 VITALS — BP 100/60 | HR 122 | Temp 99.5°F | Ht 68.0 in | Wt 123.0 lb

## 2023-11-22 DIAGNOSIS — R509 Fever, unspecified: Secondary | ICD-10-CM

## 2023-11-22 LAB — POC INFLUENZA A&B (BINAX/QUICKVUE)
Influenza A, POC: NEGATIVE
Influenza B, POC: NEGATIVE

## 2023-11-22 LAB — POC COVID19 BINAXNOW: SARS Coronavirus 2 Ag: NEGATIVE

## 2023-11-22 MED ORDER — AZITHROMYCIN 200 MG/5ML PO SUSR: ORAL | 0 refills | Status: AC

## 2023-11-22 NOTE — Patient Instructions (Signed)
It was great to see you!  Start oral azithromycin for your throat and possible ear infection Message me if you get yeast infection  Keep me posted on symptom(s)   Take care,  Jarold Motto PA-C

## 2023-11-22 NOTE — Progress Notes (Signed)
Janice Norton is a 23 y.o. female here for a new problem.  History of Present Illness:   Chief Complaint  Patient presents with   Otalgia    Pt c/o left ear pain x 2 days, worsening. Also c/o body aches and chills.   Ear Pain She complains today of left ear pain that has persisted for the past 2 days.  Associated symptoms include body aches, sore throat, and chills. She's also been nauseous and thus she has not had anything to eat today. Also believes she's dehydrated. Does report recent exposure to flu. Has been taking ibuprofen for body aches and an ear drop that has not provided ant relief. Flu test in office today is negative. She is requesting liquid antibiotics and states she often gets a yeast infection following antibiotics.       Past Medical History:  Diagnosis Date   Seasonal allergies      Social History   Tobacco Use   Smoking status: Never   Smokeless tobacco: Never  Vaping Use   Vaping status: Never Used  Substance Use Topics   Alcohol use: No   Drug use: No    Past Surgical History:  Procedure Laterality Date   TYMPANOSTOMY TUBE PLACEMENT      No family history on file.  Allergies  Allergen Reactions   Amoxicillin-Pot Clavulanate Other (See Comments)    Yeast infection    Current Medications:   Current Outpatient Medications:    azithromycin (ZITHROMAX) 200 MG/5ML suspension, Take 12.5 mLs (500 mg total) by mouth on day one, then 6.3 mLs (250 mg total) by mouth daily x 4 days., Disp: 37.5 mL, Rfl: 0   clindamycin-benzoyl peroxide (BENZACLIN) gel, Apply topically 2 (two) times daily., Disp: 25 g, Rfl: 0   norethindrone (MICRONOR) 0.35 MG tablet, Take 1 tablet (0.35 mg total) by mouth daily., Disp: 84 tablet, Rfl: 3   ondansetron (ZOFRAN) 4 MG tablet, Take 1 tablet (4 mg total) by mouth every 8 (eight) hours as needed for nausea or vomiting., Disp: 20 tablet, Rfl: 0   citalopram (CELEXA) 10 MG tablet, Take 1 tablet (10 mg total) by mouth daily.  (Patient not taking: Reported on 11/14/2022), Disp: 90 tablet, Rfl: 3   Review of Systems:   Review of Systems  Constitutional:  Positive for chills.  HENT:  Positive for ear pain and sore throat.   Gastrointestinal:  Positive for nausea.  Musculoskeletal:        +body aches    Vitals:   Vitals:   11/22/23 1335  BP: 100/60  Pulse: (!) 122  Temp: 99.5 F (37.5 C)  TempSrc: Temporal  SpO2: 97%  Weight: 123 lb (55.8 kg)  Height: 5\' 8"  (1.727 m)     Body mass index is 18.7 kg/m.  Physical Exam:   Physical Exam Vitals and nursing note reviewed.  Constitutional:      General: She is not in acute distress.    Appearance: She is well-developed. She is not ill-appearing or toxic-appearing.  HENT:     Head: Normocephalic and atraumatic.     Right Ear: Tympanic membrane, ear canal and external ear normal. Tympanic membrane is not erythematous, retracted or bulging.     Left Ear: Tympanic membrane, ear canal and external ear normal. Tympanic membrane is not erythematous, retracted or bulging.     Nose: Nose normal.     Right Sinus: No maxillary sinus tenderness or frontal sinus tenderness.     Left Sinus: No maxillary  sinus tenderness or frontal sinus tenderness.     Mouth/Throat:     Pharynx: Uvula midline. No posterior oropharyngeal erythema.     Tonsils: Tonsillar exudate present. 2+ on the right. 2+ on the left.  Eyes:     General: Lids are normal.     Conjunctiva/sclera: Conjunctivae normal.  Neck:     Trachea: Trachea normal.  Cardiovascular:     Rate and Rhythm: Normal rate and regular rhythm.     Heart sounds: Normal heart sounds, S1 normal and S2 normal.  Pulmonary:     Effort: Pulmonary effort is normal.     Breath sounds: Normal breath sounds. No decreased breath sounds, wheezing, rhonchi or rales.  Lymphadenopathy:     Cervical: No cervical adenopathy.  Skin:    General: Skin is warm and dry.  Neurological:     Mental Status: She is alert.  Psychiatric:         Speech: Speech normal.        Behavior: Behavior normal. Behavior is cooperative.     Results for orders placed or performed in visit on 11/22/23  POC COVID-19  Result Value Ref Range   SARS Coronavirus 2 Ag Negative Negative  POC Influenza A&B(BINAX/QUICKVUE)  Result Value Ref Range   Influenza A, POC Negative Negative   Influenza B, POC Negative Negative      Assessment and Plan:   Fever, unspecified fever cause No red flags on exam.   Will initiate azithromycin for tonsillitis per orders. She may very well have strep but declined exam due to significant gag reflex. Discussed taking medications as prescribed.  Reviewed return precautions including new or worsening fever, SOB, new or worsening cough or other concerns.  Push fluids and rest.  I recommend that patient follow-up if symptoms worsen or persist despite treatment x 7-10 days, sooner if needed.   Jarold Motto, PA-C   I,Safa M Kadhim,acting as a scribe for Jarold Motto, PA.,have documented all relevant documentation on the behalf of Jarold Motto, PA,as directed by  Jarold Motto, PA while in the presence of Jarold Motto, [place].   I, Jarold Motto, [place], have reviewed all documentation for this visit. The documentation on 11/22/23 for the exam, diagnosis, procedures, and orders are all accurate and complete.

## 2023-12-04 ENCOUNTER — Encounter: Payer: Self-pay | Admitting: Physician Assistant

## 2023-12-05 ENCOUNTER — Other Ambulatory Visit: Payer: Self-pay | Admitting: Physician Assistant

## 2023-12-05 MED ORDER — FLUCONAZOLE 150 MG PO TABS: 150.0000 mg | ORAL_TABLET | Freq: Once | ORAL | 0 refills | Status: AC

## 2024-01-24 ENCOUNTER — Other Ambulatory Visit: Payer: Self-pay | Admitting: *Deleted

## 2024-01-24 MED ORDER — NORETHINDRONE 0.35 MG PO TABS: 1.0000 | ORAL_TABLET | Freq: Every day | ORAL | 3 refills | Status: AC

## 2024-02-15 ENCOUNTER — Other Ambulatory Visit: Payer: Self-pay | Admitting: Physician Assistant

## 2024-02-16 MED ORDER — ONDANSETRON HCL 4 MG PO TABS: 4.0000 mg | ORAL_TABLET | Freq: Three times a day (TID) | ORAL | 0 refills | Status: DC | PRN

## 2024-02-16 NOTE — Telephone Encounter (Signed)
Please advise on refill of Zofran

## 2024-03-30 ENCOUNTER — Other Ambulatory Visit: Payer: Self-pay | Admitting: Physician Assistant

## 2024-05-22 ENCOUNTER — Other Ambulatory Visit: Payer: Self-pay | Admitting: Physician Assistant

## 2024-05-27 ENCOUNTER — Telehealth: Admitting: Family Medicine

## 2024-05-27 ENCOUNTER — Encounter: Payer: Self-pay | Admitting: Family Medicine

## 2024-05-27 DIAGNOSIS — R21 Rash and other nonspecific skin eruption: Secondary | ICD-10-CM

## 2024-05-27 MED ORDER — TRIAMCINOLONE ACETONIDE 0.5 % EX OINT: 1.0000 | TOPICAL_OINTMENT | Freq: Two times a day (BID) | CUTANEOUS | 0 refills | Status: AC

## 2024-05-27 MED ORDER — HYDROXYZINE HCL 25 MG PO TABS: 25.0000 mg | ORAL_TABLET | Freq: Three times a day (TID) | ORAL | 0 refills | Status: AC | PRN

## 2024-05-27 NOTE — Progress Notes (Signed)
   Janice Norton is a 23 y.o. female who presents today for a virtual office visit.  Assessment/Plan:  Rash Discussed limitations of virtual visit and inability to perform physical exam.  Likely has contact dermatitis based on pictures on her phone.  Will start topical triamcinolone .  Start hydroxyzine  3 times daily as needed for itching as well.  She will let us  know if not improving in the next several days.  We discussed reasons to return to care.    Subjective:  HPI:  See Assessment / plan for status of chronic conditions.   Discussed the use of AI scribe software for clinical note transcription with the patient, who gave verbal consent to proceed.  History of Present Illness Janice Norton is a 23 year old female who presents with a pruritic rash, suspected to be poison ivy exposure.  She has developed an intensely itchy rash located on the back right of her thigh, just below her buttock. The rash has been present for three days and has significantly disrupted her sleep, keeping her up for the last three nights due to discomfort.  She is uncertain of direct contact with poison ivy but recalls playing pickleball three days ago and sitting on the court, which is the only surface she has sat on recently. She has not noticed any other potential exposures.  The patient reports that the rash itches intensely and is located on the back right of her thigh, just below her buttock.Significant itching and sleep disturbance due to the rash.         Objective/Observations  Physical Exam: Gen: NAD, resting comfortably Pulm: Normal work of breathing Neuro: Grossly normal, moves all extremities Skin: Erythematous raised rash on right posterior thigh Psych: Normal affect and thought content  Virtual Visit via Video   I connected with Janice Norton on 05/27/24 at 11:20 AM EDT by a video enabled telemedicine application and verified that I am speaking with the correct person using two  identifiers. The limitations of evaluation and management by telemedicine and the availability of in person appointments were discussed. The patient expressed understanding and agreed to proceed.   Patient location: Home Provider location: [place] [facility] [employer] Persons participating in the virtual visit: Myself and Patient     [employer] HERO. Kennyth, MD 05/27/2024 11:28 AM

## 2024-08-17 ENCOUNTER — Other Ambulatory Visit: Payer: Self-pay | Admitting: Physician Assistant

## 2024-08-19 ENCOUNTER — Encounter: Payer: Self-pay | Admitting: Physician Assistant

## 2024-08-19 MED ORDER — ONDANSETRON HCL 4 MG PO TABS: 4.0000 mg | ORAL_TABLET | Freq: Three times a day (TID) | ORAL | 0 refills | Status: DC | PRN

## 2024-09-29 ENCOUNTER — Other Ambulatory Visit: Payer: Self-pay | Admitting: Physician Assistant

## 2024-09-30 NOTE — Telephone Encounter (Signed)
 Last filled: 08/19/24  Quantity: 20  Please advise on refill for patient

## 2024-11-14 ENCOUNTER — Other Ambulatory Visit: Payer: Self-pay | Admitting: *Deleted

## 2024-11-14 MED ORDER — ONDANSETRON HCL 4 MG PO TABS: 4.0000 mg | ORAL_TABLET | Freq: Three times a day (TID) | ORAL | 0 refills | Status: AC | PRN
